# Patient Record
Sex: Male | Born: 2005 | Race: White | Hispanic: No | Marital: Single | Smoking: Never smoker
Health system: Southern US, Community
[De-identification: ages and names within clinical notes are randomized; demographics above are authoritative.]

## PROBLEM LIST (undated history)

## (undated) DIAGNOSIS — J45909 Unspecified asthma, uncomplicated: Secondary | ICD-10-CM

## (undated) DIAGNOSIS — K219 Gastro-esophageal reflux disease without esophagitis: Secondary | ICD-10-CM

## (undated) DIAGNOSIS — F909 Attention-deficit hyperactivity disorder, unspecified type: Secondary | ICD-10-CM

## (undated) HISTORY — DX: Gastro-esophageal reflux disease without esophagitis: K21.9

## (undated) HISTORY — PX: TYMPANOSTOMY TUBE PLACEMENT: SHX32

## (undated) HISTORY — PX: ADENOIDECTOMY: SUR15

## (undated) HISTORY — PX: TONSILLECTOMY: SUR1361

---
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
## 2024-04-20 ENCOUNTER — Other Ambulatory Visit: Payer: Self-pay

## 2024-04-20 ENCOUNTER — Ambulatory Visit
Admission: EM | Admit: 2024-04-20 | Discharge: 2024-04-20 | Disposition: A | Discharge status: Home / Self Care | Attending: Emergency Medicine | Admitting: Emergency Medicine

## 2024-04-20 DIAGNOSIS — Z23 Encounter for immunization: Secondary | ICD-10-CM | POA: Diagnosis not present

## 2024-04-20 DIAGNOSIS — S0501XA Injury of conjunctiva and corneal abrasion without foreign body, right eye, initial encounter: Secondary | ICD-10-CM

## 2024-04-20 MED ORDER — ERYTHROMYCIN 5 MG/GM OP OINT: TOPICAL_OINTMENT | OPHTHALMIC | 0 refills | Status: AC

## 2024-04-20 MED ORDER — TETANUS-DIPHTH-ACELL PERTUSSIS 5-2-15.5 LF-MCG/0.5 IM SUSP
0.5000 mL | Freq: Once | INTRAMUSCULAR | Status: AC
  Administered 2024-04-20: 0.5 mL via INTRAMUSCULAR

## 2024-04-20 NOTE — ED Triage Notes (Addendum)
 Pt presents with a chief complaint of right eye pain. 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 Currently rates pain a 4/10. Very sensitive to light at this time. Flushed the eye out directly after. Metal did come out. Teacher felt like he still saw fragments of metal in the eye. Advised him to go to UC.

## 2024-04-20 NOTE — ED Provider Notes (Signed)
 [facility]    CSN: [id number] Arrival date & time: 04/20/24  1238      History   Chief Complaint Chief Complaint  Patient presents with   Eye Pain    HPI Brendan Kennedy is a 18 y.o. male.   Patient presents to clinic with concerns of right eye pain.  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  Foot a burning sensation.  Went to his teacher who saw a piece of metal and rinsed out his eye.  Patient was able to finish class.  Was having right eye discomfort at the end of class so he decided to come into clinic today.  Accompanied by his mother.  Denies vision changes.  Is sensitive to light.  Last Tdap in 2017.  The history is provided by the patient and medical records.  Eye Pain    Past Medical History:  Diagnosis Date   ADHD (attention deficit hyperactivity disorder)    Asthma    GERD (gastroesophageal reflux disease)    Palpitations 03/09/2022    Patient Active Problem List   Diagnosis Date Noted   Dyspepsia 01/21/2023   Bronchitis 05/06/2022   Mild persistent asthma 05/06/2022   Palpitations 03/09/2022   Chest pain 02/24/2022   Tick bite of lower back 02/24/2022   Anxiety 02/24/2022   LUQ pain 02/24/2022   Lower abdominal pain 02/24/2022   ADHD (attention deficit hyperactivity disorder) 08/23/2013    Past Surgical History:  Procedure Laterality Date   ADENOIDECTOMY     TONSILLECTOMY     TYMPANOSTOMY TUBE PLACEMENT         Home Medications    Prior to Admission medications   Medication Sig Start Date End Date Taking? Authorizing Provider  erythromycin ophthalmic ointment Place a 1/2 inch ribbon of ointment into the right lower eyelid 4 times daily for the next 5 days to help prevent infection. 04/20/24  Yes Kimoni Pickerill  N, FNP  albuterol  (VENTOLIN  HFA) 108 (90 Base) MCG/ACT inhaler Inhale 1-2 puffs into the lungs  every 6 (six) hours as needed for wheezing or shortness of breath. 01/20/23   Mayer, Jodi R, NP  ALPRAZolam  (XANAX ) 0.25 MG tablet Take 1 tablet (0.25 mg total) by mouth 2 (two) times daily as needed for anxiety. 02/25/22   Antonio Cyndee Jamee JONELLE, DO  ALPRAZolam  (XANAX ) 0.25 MG tablet Take 1 tablet (0.25 mg total) by mouth 2 (two) times daily as needed for anxiety. 01/21/23   Antonio Cyndee Jamee JONELLE, DO  cetirizine (ZYRTEC) 10 MG tablet Take 10 mg by mouth daily. 02/12/22   [provider]  escitalopram  (LEXAPRO ) 10 MG tablet Take 1 tablet (10 mg total) by mouth daily. 01/21/23   Antonio Cyndee Jamee JONELLE, DO  omeprazole  (PRILOSEC) 20 MG capsule Take 1 capsule (20 mg total) by mouth 2 (two) times daily before a meal. 01/21/23   Antonio Cyndee, Jamee JONELLE, DO    Family History Family History  Problem Relation Age of Onset   Anxiety disorder Mother    Depression Father    Hypertension Paternal Grandmother    Heart disease Paternal Grandmother    Stroke Paternal Grandfather    Hyperlipidemia Paternal Grandfather    Heart disease Paternal Grandfather    Hearing loss Paternal Grandfather    Drug abuse Paternal Grandfather     Social History Social  History   Tobacco Use   Smoking status: Never    Passive exposure: Yes   Smokeless tobacco: Never  Vaping Use   Vaping status: Every Day   Substances: Nicotine, Flavoring  Substance Use Topics   Alcohol use: Never   Drug use: Not Currently    Types: Marijuana     Allergies   Patient has no known allergies.   Review of Systems Review of Systems  Per HPI  Physical Exam Triage Vital Signs ED Triage Vitals  Encounter Vitals Group     BP 04/20/24 1305 132/73     Girls Systolic BP Percentile --      Girls Diastolic BP Percentile --      Boys Systolic BP Percentile --      Boys Diastolic BP Percentile --      Pulse Rate 04/20/24 1305 68     Resp 04/20/24 1305 16     Temp 04/20/24 1305 97.9 F (36.6 C)     Temp Source 04/20/24 1305  Oral     SpO2 04/20/24 1305 97 %     Weight 04/20/24 1305 175 lb (79.4 kg)     Height 04/20/24 1305 5' 10 (1.778 m)     Head Circumference --      Peak Flow --      Pain Score 04/20/24 1344 4     Pain Loc --      Pain Education --      Exclude from Growth Chart --    No data found.  Updated Vital Signs BP 132/73 (BP Location: Right Arm)   Pulse 68   Temp 97.9 F (36.6 C) (Oral)   Resp 16   Ht 5' 10 (1.778 m)   Wt 175 lb (79.4 kg)   SpO2 97%   BMI 25.11 kg/m   Visual Acuity Right Eye Distance:   Left Eye Distance:   Bilateral Distance:    Right Eye Near:   Left Eye Near:    Bilateral Near:     Physical Exam Vitals and nursing note reviewed.  Constitutional:      Appearance: Normal appearance.  HENT:     Head: Normocephalic and atraumatic.     Right Ear: External ear normal.     Left Ear: External ear normal.     Nose: Nose normal.     Mouth/Throat:     Mouth: Mucous membranes are moist.  Eyes:     General: Lids are normal. Lids are everted, no foreign bodies appreciated. Vision grossly intact. Gaze aligned appropriately.        Right eye: No foreign body, discharge or hordeolum.     Extraocular Movements: Extraocular movements intact.     Conjunctiva/sclera:     Right eye: Right conjunctiva is not injected.     Pupils: Pupils are equal, round, and reactive to light.   Cardiovascular:     Rate and Rhythm: Normal rate.  Pulmonary:     Effort: Pulmonary effort is normal. No respiratory distress.  Neurological:     General: No focal deficit present.     Mental Status: He is alert and oriented to person, place, and time.  Psychiatric:        Mood and Affect: Mood normal.        Behavior: Behavior normal. Behavior is cooperative.      UC Treatments / Results  Labs (all labs ordered are listed, but only abnormal results are displayed) Labs Reviewed - No data to display  EKG   Radiology No results found.  Procedures Procedures (including  critical care time)  Medications Ordered in UC Medications  Tdap (ADACEL) injection 0.5 mL (has no administration in time range)    Initial Impression / Assessment and Plan / UC Course  I have reviewed the triage vital signs and the nursing notes.  Pertinent labs & imaging results that were available during my care of the patient were reviewed by me and considered in my medical decision making (see chart for details).  Vitals and triage reviewed, patient is hemodynamically stable. PERRLA, EOMI.   Fluorescein stain reveals pinpoint corneal abrasion to the right lateral eye.  Will cover for bacterial etiology with erythromycin ointment.  Tdap updated in clinic, last updated in 2017, over 5 years ago.  Patient grossly intact.  Symptomatic management for corneal abrasion.  Plan of care, follow-up care and return precautions given, no questions at this time.  Work note provided.    Final Clinical Impressions(s) / UC Diagnoses   Final diagnoses:  Abrasion of right cornea, initial encounter     Discharge Instructions      On the fluorescein stain it revealed a small corneal abrasion to the right outer portion of the eye.  This is most likely from the metallic foreign body.  We did not see any retained foreign bodies.  Use the erythromycin ointment 4 times daily for the next 5 days to help soothe and coat the eye as well as prevent any infection.  We have updated your Tdap today and it is good for the next 5 to 10 years.  Seek immediate care with an ophthalmologist if you develop any vision loss, severe pain, or new concerning symptoms.      ED Prescriptions     Medication Sig Dispense Auth. Provider   erythromycin ophthalmic ointment Place a 1/2 inch ribbon of ointment into the right lower eyelid 4 times daily for the next 5 days to help prevent infection. 3.5 g Dreama, Jeanne Terrance  N, FNP      PDMP not reviewed this encounter.   Dreama Gus SAILOR, FNP 04/20/24 1408

## 2024-04-20 NOTE — Discharge Instructions (Addendum)
 On the fluorescein stain it revealed a small corneal abrasion to the right outer portion of the eye.  This is most likely from the metallic foreign body.  We did not see any retained foreign bodies.  Use the erythromycin ointment 4 times daily for the next 5 days to help soothe and coat the eye as well as prevent any infection.  We have updated your Tdap today and it is good for the next 5 to 10 years.  Seek immediate care with an ophthalmologist if you develop any vision loss, severe pain, or new concerning symptoms.

## 2024-07-10 ENCOUNTER — Ambulatory Visit: Payer: Self-pay

## 2024-07-10 NOTE — Telephone Encounter (Signed)
 FYI Only or Action Required?: Action required by provider: request for appointment.  Patient was last seen in primary care on 01/21/2023 by Antonio Meth, Jamee SAUNDERS, DO.  Called Nurse Triage reporting Cough.  Symptoms began several weeks ago.  Interventions attempted: Prescription medications: has taken 2 antibiotics..  Symptoms are: gradually worsening. Cough is worsening, has been on 2 antibiotics. Sputum blood-tinged today. Mom asking if Dr. Antonio will worked him in this week.   Triage Disposition: See HCP Within 4 Hours (Or PCP Triage)  Patient/caregiver understands and will follow disposition?: Yes     Copied from CRM #[id number]. Topic: Clinical - Red Word Triage >> Jul 10, 2024  9:33 AM Alfonso HERO wrote: Patient is coughing up blood Reason for Disposition  [1] MILD difficulty breathing (e.g., minimal/no SOB at rest, SOB with walking, pulse < 100) AND [2] still present when not coughing  Answer Assessment - Initial Assessment Questions 1. ONSET: When did the cough begin?      2 weeks 2. SEVERITY: How bad is the cough today?      severe 3. SPUTUM: Describe the color of your sputum (e.g., none, dry cough; clear, white, yellow, green)     bloody 4. HEMOPTYSIS: Are you coughing up any blood? If Yes, ask: How much? (e.g., flecks, streaks, tablespoons, etc.)     yes 5. DIFFICULTY BREATHING: Are you having difficulty breathing? If Yes, ask: How bad is it? (e.g., mild, moderate, severe)      Yes - nebulizer 6. FEVER: Do you have a fever? If Yes, ask: What is your temperature, how was it measured, and when did it start?     On and off 7. CARDIAC HISTORY: Do you have any history of heart disease? (e.g., heart attack, congestive heart failure)      no 8. LUNG HISTORY: Do you have any history of lung disease?  (e.g., pulmonary embolus, asthma, emphysema)     no 9. PE RISK FACTORS: Do you have a history of blood clots? (or: recent major surgery, recent prolonged  travel, bedridden)     no 10. OTHER SYMPTOMS: Do you have any other symptoms? (e.g., runny nose, wheezing, chest pain)       Mild wheezing 11. PREGNANCY: Is there any chance you are pregnant? When was your last menstrual period?       N/a 12. TRAVEL: Have you traveled out of the country in the last month? (e.g., travel history, exposures)       no  Protocols used: Cough - Acute Productive-A-AH

## 2024-07-10 NOTE — Telephone Encounter (Signed)
 There are no openings until Monday.

## 2024-07-10 NOTE — Telephone Encounter (Signed)
 Pt seen at [facility] twice. Notes are in chart. Please advise

## 2024-07-11 NOTE — Telephone Encounter (Signed)
 Pt did not want the appt on Monday. Declined to sched
